# Patient Record
Sex: Male | Born: 1955 | Race: White | Hispanic: No | Marital: Married | Smoking: Former smoker
Health system: Southern US, Community
[De-identification: ages and names within clinical notes are randomized; demographics above are authoritative.]

## PROBLEM LIST (undated history)

## (undated) DIAGNOSIS — M5020 Other cervical disc displacement, unspecified cervical region: Secondary | ICD-10-CM

## (undated) DIAGNOSIS — M549 Dorsalgia, unspecified: Secondary | ICD-10-CM

## (undated) HISTORY — PX: CORONARY ANGIOPLASTY: SHX604

---

## 2002-02-02 ENCOUNTER — Encounter: Payer: Self-pay | Admitting: Internal Medicine

## 2002-02-02 ENCOUNTER — Ambulatory Visit (HOSPITAL_COMMUNITY): Admission: RE | Admit: 2002-02-02 | Discharge: 2002-02-02 | Payer: Self-pay | Admitting: Internal Medicine

## 2002-02-04 ENCOUNTER — Ambulatory Visit (HOSPITAL_COMMUNITY): Admission: RE | Admit: 2002-02-04 | Discharge: 2002-02-04 | Payer: Self-pay | Admitting: Internal Medicine

## 2002-02-04 ENCOUNTER — Encounter: Payer: Self-pay | Admitting: Internal Medicine

## 2004-01-01 ENCOUNTER — Observation Stay (HOSPITAL_COMMUNITY): Admission: EM | Admit: 2004-01-01 | Discharge: 2004-01-02 | Payer: Self-pay | Admitting: Emergency Medicine

## 2006-11-23 ENCOUNTER — Emergency Department (HOSPITAL_COMMUNITY): Admission: EM | Admit: 2006-11-23 | Discharge: 2006-11-23 | Payer: Self-pay | Admitting: Emergency Medicine

## 2008-02-20 ENCOUNTER — Emergency Department (HOSPITAL_COMMUNITY): Admission: EM | Admit: 2008-02-20 | Discharge: 2008-02-20 | Payer: Self-pay | Admitting: Emergency Medicine

## 2008-08-15 ENCOUNTER — Ambulatory Visit (HOSPITAL_COMMUNITY): Admission: RE | Admit: 2008-08-15 | Discharge: 2008-08-15 | Payer: Self-pay | Admitting: Family Medicine

## 2008-09-28 ENCOUNTER — Ambulatory Visit (HOSPITAL_COMMUNITY): Admission: RE | Admit: 2008-09-28 | Discharge: 2008-09-28 | Payer: Self-pay | Admitting: Family Medicine

## 2009-08-25 ENCOUNTER — Ambulatory Visit (HOSPITAL_COMMUNITY): Admission: RE | Admit: 2009-08-25 | Discharge: 2009-08-25 | Payer: Self-pay | Admitting: Family Medicine

## 2010-08-26 ENCOUNTER — Encounter: Payer: Self-pay | Admitting: Family Medicine

## 2010-12-21 NOTE — Discharge Summary (Signed)
NAME:  Matthew Brandt, Matthew Brandt                           ACCOUNT NO.:  [id number]   MEDICAL RECORD NO.:  [id number]                   PATIENT TYPE:  INP   LOCATION:  A223                                 FACILITY:  [facility]   PHYSICIAN:  Kirk Ruths, M.D.            DATE OF BIRTH:  10-Jan-1956   DATE OF ADMISSION:  DATE OF DISCHARGE:  01/02/2004                                 DISCHARGE SUMMARY   DISCHARGE DIAGNOSES:  Chest pain, myocardial infarction ruled out.   HOSPITAL COURSE:  This is a 55 year old white male who awoke approximately 4  a.m. with a severe aching in his chest across the mid anterior chest,  rotating to the left shoulder.  The patient was treated in the emergency  room with morphine and nitroglycerin without significant relief, although he  did get significant relief with a GI cocktail.  The patient has had a  history of gastroesophageal reflux disease in the past.  He has a similar  episode two years ago, and underwent normal cardiac catheterization.  He was  diagnosed with gastroesophageal reflux disease.   The patient's cardiac enzymes initially and EKG initially were normal.  Serial enzymes were negative.  The patient's EKG was normal.  He is  significantly improved.  Some mild chest wall soreness.  Some mild  gastroesophageal reflux disease symptoms.  Due to the fact that he has a  normal catheterization within two years relieved by GI cocktail, I feel like  this is more likely gastroesophageal reflux disease.  We will treat him with  Aciphex twice a day for two weeks and then daily.  We will follow him up in  two weeks.  If he is still having problems, we will arrange for cardiology  and/or GI evaluation.     ___________________________________________                                         Kirk Ruths, M.D.   WMM/MEDQ  D:  01/02/2004  T:  01/02/2004  Job:  [id number]

## 2010-12-21 NOTE — H&P (Signed)
NAME:  Matthew Brandt, Matthew Brandt                           ACCOUNT NO.:  [id number]   MEDICAL RECORD NO.:  [id number]                   PATIENT TYPE:  EMS   LOCATION:  ED                                   FACILITY:  [facility]   PHYSICIAN:  Kirk Ruths, M.D.            DATE OF BIRTH:  05-15-1956   DATE OF ADMISSION:  01/01/2004  DATE OF DISCHARGE:                                HISTORY & PHYSICAL   CHIEF COMPLAINT:  Chest pain.   PRESENTING ILLNESS:  This is a 55 year old white male who awoke at  approximately 4 a.m. with what he described as severe aching chest pain  across the mid anterior chest area radiating up into the left shoulder.  The  patient was seen in the emergency room, given morphine and nitroglycerin.  He states the nitroglycerin did not help but was significantly relieved by a  GI cocktail.  The patient denies shortness of breath or diaphoresis or  nausea.  The patient did undergo a cardiac catheterization approximately 2  years ago for similar-type pain.  The coronaries were clear and it was felt  the patient had GERD.  Due to the severe nature of his chest pain he will be  admitted for rule out MI.  Initial cardiac enzymes and chest x-ray are  negative.   PAST MEDICAL HISTORY:  He is allergic to CODEINE which causes itching.  He  takes no medications regularly.  The patient is a nonsmoker.   REVIEW OF SYSTEMS:  Noncontributory.   PHYSICAL EXAMINATION:  GENERAL:  Well-developed, well-nourished white male  who appears in excellent health.  VITAL SIGNS:  He is afebrile.  Blood pressure is 150/95, pulse is 80 and  regular, respirations are 20 and unlabored.  HEENT:  TMs normal.  Pupils equal to light and accommodation.  Oropharynx  benign.  NECK:  Supple without JVD, bruit, or thyromegaly.  LUNGS:  Clear in all areas.  HEART:  Regular sinus rhythm with no murmur, gallop, or rub.  There is some  mild chest wall discomfort on palpation.  ABDOMEN:  Mild epigastric discomfort.   Otherwise, abdomen is soft and  nontender with normal active bowel sounds.  EXTREMITIES:  Without clubbing, cyanosis, or edema.  NEUROLOGIC:  Grossly intact.   ASSESSMENT:  1. Chest pain, rule out myocardial infarction.  2. Probable gastroesophageal reflux disease.     ___________________________________________                                         Kirk Ruths, M.D.   WMM/MEDQ  D:  01/01/2004  T:  01/01/2004  Job:  [id number]

## 2011-05-03 LAB — BASIC METABOLIC PANEL
GFR calc non Af Amer: 57 — ABNORMAL LOW
Potassium: 3.9
Sodium: 138

## 2011-05-03 LAB — DIFFERENTIAL
Eosinophils Relative: 0
Lymphocytes Relative: 18
Lymphs Abs: 0.7
Monocytes Absolute: 0.3

## 2011-05-03 LAB — CBC
HCT: 40.3
Hemoglobin: 13.8
WBC: 3.9 — ABNORMAL LOW

## 2014-10-30 ENCOUNTER — Encounter (HOSPITAL_COMMUNITY): Payer: Self-pay | Admitting: Emergency Medicine

## 2014-10-30 ENCOUNTER — Emergency Department (HOSPITAL_COMMUNITY)
Admission: EM | Admit: 2014-10-30 | Discharge: 2014-10-30 | Disposition: A | Discharge status: Home / Self Care | Payer: 59 | Attending: Emergency Medicine | Admitting: Emergency Medicine

## 2014-10-30 DIAGNOSIS — Y288XXA Contact with other sharp object, undetermined intent, initial encounter: Secondary | ICD-10-CM | POA: Insufficient documentation

## 2014-10-30 DIAGNOSIS — Y9389 Activity, other specified: Secondary | ICD-10-CM | POA: Insufficient documentation

## 2014-10-30 DIAGNOSIS — M549 Dorsalgia, unspecified: Secondary | ICD-10-CM | POA: Diagnosis not present

## 2014-10-30 DIAGNOSIS — Z87891 Personal history of nicotine dependence: Secondary | ICD-10-CM | POA: Diagnosis not present

## 2014-10-30 DIAGNOSIS — S61219A Laceration without foreign body of unspecified finger without damage to nail, initial encounter: Secondary | ICD-10-CM

## 2014-10-30 DIAGNOSIS — S61211A Laceration without foreign body of left index finger without damage to nail, initial encounter: Secondary | ICD-10-CM | POA: Diagnosis present

## 2014-10-30 DIAGNOSIS — Y998 Other external cause status: Secondary | ICD-10-CM | POA: Diagnosis not present

## 2014-10-30 DIAGNOSIS — Y9289 Other specified places as the place of occurrence of the external cause: Secondary | ICD-10-CM | POA: Insufficient documentation

## 2014-10-30 HISTORY — DX: Dorsalgia, unspecified: M54.9

## 2014-10-30 HISTORY — DX: Other cervical disc displacement, unspecified cervical region: M50.20

## 2014-10-30 MED ORDER — TETANUS-DIPHTH-ACELL PERTUSSIS 5-2.5-18.5 LF-MCG/0.5 IM SUSP: 0.5000 mL | Freq: Once | INTRAMUSCULAR | Status: DC

## 2014-10-30 MED ORDER — TETANUS-DIPHTH-ACELL PERTUSSIS 5-2.5-18.5 LF-MCG/0.5 IM SUSP
INTRAMUSCULAR | Status: AC
  Filled 2014-10-30: qty 0.5

## 2014-10-30 MED ORDER — BACITRACIN ZINC 500 UNIT/GM EX OINT
TOPICAL_OINTMENT | CUTANEOUS | Status: AC
  Filled 2014-10-30: qty 0.9

## 2014-10-30 MED ORDER — LIDOCAINE HCL (PF) 1 % IJ SOLN
INTRAMUSCULAR | Status: AC
  Filled 2014-10-30: qty 5

## 2014-10-30 MED ORDER — POVIDONE-IODINE 10 % EX SOLN
CUTANEOUS | Status: AC
  Filled 2014-10-30: qty 118

## 2014-10-30 NOTE — Discharge Instructions (Signed)
Please keep the wounds clean and dry. Please have your sutures removed in the next 7-10 days. Please see your primary physician, or return to the emergency department if any signs of advancing infection. Sutured Wound Care Sutures are stitches that can be used to close wounds. Caring for your wound can help stop infection and lessen pain. HOME CARE   Rest and raise (elevate) the injured area until the pain and puffiness (swelling) go away.  Only take medicines as told by your doctor.  Clean the wound gently with mild soap and water once a day after the first 2 days. Rinse off the soap. Pat the area dry with a clean towel. Do not rub the wound.  Change the bandage (dressing) as told by your doctor. If the bandage sticks, soak it off with soapy water. Stop using a bandage after 2 days or after the wound stops leaking fluid.  Put cream on the wound as told by your doctor.  Do not stretch the wound.  Drink enough fluids to keep your pee (urine) clear or pale yellow.  See your doctor to have the sutures removed.  Use sunscreen or sunblock on the wound after it heals. GET HELP RIGHT AWAY IF:   Your wound gets red, puffy, hot, or tender.  You have more pain in the wound.  You have a red streak that goes away from the wound.  You see yellowish-white fluid (pus) coming out of the wound.  You have a fever.  You have chills and start to shake.  You notice a bad smell coming from the wound.  Your wound will not stop bleeding. MAKE SURE YOU:   Understand these instructions.  Will watch your condition.  Will get help right away if you are not doing well or get worse. Document Released: 01/08/2008 Document Revised: 10/14/2011 Document Reviewed: 11/25/2010 [facility] Patient Information 2015 [place], [place]. This information is not intended to replace advice given to you by your health care provider. Make sure you discuss any questions you have with your health care provider.

## 2014-10-30 NOTE — ED Notes (Addendum)
Patient reports cut left index and left middle finger while cutting ham. No active bleeding at this time to lacerations.

## 2014-10-30 NOTE — ED Provider Notes (Signed)
CSN: [id number]     Arrival date & time 10/30/14  1422 History  This chart was scribed for Ivery Quale, PA-C, working with No att. providers found by Leona Carry, ED Scribe. The patient was seen in [facility]. The patient's care was started at 3:08 PM.     Chief Complaint  Patient presents with  . Extremity Laceration   Patient is a 59 y.o. male presenting with skin laceration. The history is provided by the patient. No language interpreter was used.  Laceration Location:  Finger Finger laceration location:  L index finger and L middle finger Depth:  Cutaneous Time since incident:  1 hour Laceration mechanism:  Knife Pain details:    Quality:  Aching   Severity:  Moderate   Timing:  Intermittent   Progression:  Worsening Foreign body present:  No foreign bodies Relieved by:  Pressure Worsened by:  Nothing tried Tetanus status:  Up to date  HPI Comments: Matthew Brandt is a 59 y.o. male who presents to the Emergency Department complaining of a laceration to his left index finger and middle finger that occurred immediately prior to arrival. Patient reports that the injury occurred while he was carving a ham. He denies a history of bleeding disorders. No past surgical history to the fingers on his left hand. Patient reports that his tetanus is not up to date.  PCP is Dr. Phillips Odor.  Past Medical History  Diagnosis Date  . Back pain   . Herniated disc, cervical    History reviewed. No pertinent past surgical history. History reviewed. No pertinent family history. History  Substance Use Topics  . Smoking status: Former Games developer  . Smokeless tobacco: Not on file  . Alcohol Use: No    Review of Systems  Musculoskeletal: Positive for back pain.  Skin: Positive for wound (laceration).  All other systems reviewed and are negative.     Allergies  Codeine  Home Medications   Prior to Admission medications   Not on File   Triage Vitals: BP 171/99 mmHg  Pulse 82   Temp(Src) 98.2 F (36.8 C) (Oral)  Resp 20  Ht  (1.803 m)  Wt 189 lb 9.6 oz (86.002 kg)  BMI 26.46 kg/m2  SpO2 97% Physical Exam  Constitutional: He is oriented to person, place, and time. He appears well-developed and well-nourished. No distress.  HENT:  Head: Normocephalic and atraumatic.  Eyes: Conjunctivae and EOM are normal.  Neck: Neck supple. No tracheal deviation present.  Cardiovascular: Normal rate.   Pulmonary/Chest: Effort normal. No respiratory distress.  Musculoskeletal: Normal range of motion.  Full ROM of all fingers on the left hand. 1.4 cm laceration of the left index finger between the PIP and DIP joint. Some active bleeding. 1.2 cm laceration of the left index finger just below the DIP joint. No crepitus, no deformity, no bone involvement.  Neurological: He is alert and oriented to person, place, and time.  Skin: Skin is warm and dry.  Psychiatric: He has a normal mood and affect. His behavior is normal.  Nursing note and vitals reviewed.   ED Course  Procedures (including critical care time)   LACERATION REPAIR OF LEFT INDEX AND MIDDLE FINGER. Patient identified by arm band. Permission for the procedure is given by the patient. The patient sustained laceration to the left index and middle finger. The procedure was explained to the patient in terms which he understands and he is in agreement with this plan.  The left index finger  was painted with Betadine. Procedural time out taken. A digital block was completed with 1% plain lidocaine. After proper anesthetic, the patient was draped in the usual sterile fashion. The wound was irrigated with saline. The finger was then again painted with Betadine. The area was repaired with 3 interrupted sutures of 4-0 nylon. The wound measures 1.4 cm. Sterile dressing applied.  The left middle finger was painted with Betadine. A digital block was completed with 1% plain lidocaine. After proper anesthetic the patient was  draped in the usual sterile manner. The wound was irrigated with saline. The finger was again painted with Betadine and repaired with 3 interrupted sutures of 4-0 nylon. The wound measures 1.2 cm. There was no foreign body appreciated in either of the index finger or middle finger lacerations. It was no bone or tendon involvement. After the procedure the patient has good range of motion of the fingers, and no sensory deficits. Sterile dressing applied to the middle finger. Patient tolerated the procedures without problem. DIAGNOSTIC STUDIES: Oxygen Saturation is 97% on room air, normal by my interpretation.    COORDINATION OF CARE:Discharge instructions reviewed. Pt in agreement with d/c plan.    Labs Review Labs Reviewed - No data to display  Imaging Review No results found.   EKG Interpretation None      MDM  Patient sustained a 1.2 cm laceration of the left middle finger, a 1.4 cm laceration of the index finger of the left hand. The wounds were repaired without problem. There is no bone or tendon involvement.    Final diagnoses:  None    *I have reviewed nursing notes, vital signs, and all appropriate lab and imaging results for this patient.**  **I personally performed the services described in this documentation, which was scribed in my presence. The recorded information has been reviewed and is accurate.Ivery Quale*   Matthew Kozakiewicz, PA-C 11/02/14 1447  Vanetta MuldersScott Zackowski, MD 11/09/14 [phone]

## 2015-05-25 ENCOUNTER — Encounter (INDEPENDENT_AMBULATORY_CARE_PROVIDER_SITE_OTHER): Payer: Self-pay | Admitting: *Deleted

## 2016-09-06 DIAGNOSIS — J069 Acute upper respiratory infection, unspecified: Secondary | ICD-10-CM | POA: Diagnosis not present

## 2017-05-08 DIAGNOSIS — L905 Scar conditions and fibrosis of skin: Secondary | ICD-10-CM | POA: Diagnosis not present

## 2017-05-08 DIAGNOSIS — Z933 Colostomy status: Secondary | ICD-10-CM | POA: Diagnosis not present

## 2017-05-12 DIAGNOSIS — Z23 Encounter for immunization: Secondary | ICD-10-CM | POA: Diagnosis not present

## 2017-07-12 ENCOUNTER — Encounter (HOSPITAL_COMMUNITY): Payer: Self-pay

## 2017-07-12 ENCOUNTER — Emergency Department (HOSPITAL_COMMUNITY)
Admission: EM | Admit: 2017-07-12 | Discharge: 2017-07-12 | Disposition: A | Discharge status: Home / Self Care | Payer: 59 | Attending: Emergency Medicine | Admitting: Emergency Medicine

## 2017-07-12 DIAGNOSIS — Y9389 Activity, other specified: Secondary | ICD-10-CM | POA: Insufficient documentation

## 2017-07-12 DIAGNOSIS — T1591XA Foreign body on external eye, part unspecified, right eye, initial encounter: Secondary | ICD-10-CM | POA: Diagnosis not present

## 2017-07-12 DIAGNOSIS — H5711 Ocular pain, right eye: Secondary | ICD-10-CM | POA: Diagnosis not present

## 2017-07-12 DIAGNOSIS — Z87891 Personal history of nicotine dependence: Secondary | ICD-10-CM | POA: Insufficient documentation

## 2017-07-12 DIAGNOSIS — Y929 Unspecified place or not applicable: Secondary | ICD-10-CM | POA: Diagnosis not present

## 2017-07-12 DIAGNOSIS — T1501XA Foreign body in cornea, right eye, initial encounter: Secondary | ICD-10-CM | POA: Diagnosis not present

## 2017-07-12 DIAGNOSIS — X58XXXA Exposure to other specified factors, initial encounter: Secondary | ICD-10-CM | POA: Diagnosis not present

## 2017-07-12 DIAGNOSIS — Y999 Unspecified external cause status: Secondary | ICD-10-CM | POA: Insufficient documentation

## 2017-07-12 MED ORDER — FLUORESCEIN SODIUM 1 MG OP STRP
1.0000 | ORAL_STRIP | Freq: Once | OPHTHALMIC | Status: AC
  Administered 2017-07-12: 1 via OPHTHALMIC

## 2017-07-12 MED ORDER — TETRACAINE HCL 0.5 % OP SOLN
2.0000 [drp] | Freq: Once | OPHTHALMIC | Status: AC
  Administered 2017-07-12: 2 [drp] via OPHTHALMIC

## 2017-07-12 MED ORDER — TETRACAINE HCL 0.5 % OP SOLN: 2.0000 [drp] | Freq: Once | OPHTHALMIC | Status: DC

## 2017-07-12 MED ORDER — KETOROLAC TROMETHAMINE 0.5 % OP SOLN
1.0000 [drp] | Freq: Once | OPHTHALMIC | Status: AC
  Administered 2017-07-12: 1 [drp] via OPHTHALMIC
  Filled 2017-07-12: qty 5

## 2017-07-12 MED ORDER — TETRACAINE HCL 0.5 % OP SOLN
OPHTHALMIC | Status: AC
  Administered 2017-07-12: 2 [drp] via OPHTHALMIC
  Filled 2017-07-12: qty 4

## 2017-07-12 MED ORDER — FLUORESCEIN SODIUM 1 MG OP STRP
1.0000 | ORAL_STRIP | Freq: Once | OPHTHALMIC | Status: DC
Start: 2017-07-12 — End: 2017-07-12

## 2017-07-12 MED ORDER — FLUORESCEIN SODIUM 1 MG OP STRP
ORAL_STRIP | OPHTHALMIC | Status: AC
  Administered 2017-07-12: 1 via OPHTHALMIC
  Filled 2017-07-12: qty 1

## 2017-07-12 NOTE — ED Triage Notes (Signed)
Pt reports some dust got in his r eye 1 week ago.  Reports eye still hurts.

## 2017-07-12 NOTE — Discharge Instructions (Signed)
You may use the eye drop given placing one drop in your right eye every 6 hours if needed for continued irritation and redness.  This medicine is an anti inflammatory (like motrin for the eye).

## 2017-07-12 NOTE — ED Provider Notes (Signed)
[facility]ENC[facility] Provider Note   CSN: [id number] Arrival date & time: 07/12/17  1029     History   Chief Complaint Chief Complaint  Patient presents with  . Eye Pain    HPI Tillie FantasiaWayne D Jenne is a 61 y.o. male presenting with right eye pain and foreign body sensation times 1 week.  He was helping remove a door from an old home a week ago and was caring at outside when he felt something fall in his eye.  After multiple flushings he has been unable to retrieve the object.  He describes it feeling constantly irritated under his upper lateral right eyelid, sometimes floating to the middle of this right eyelid.  He denies any visual changes or drainage except for clear tearing.  He has found no alleviators for symptoms.  He wears corrective lenses.   The history is provided by the patient.    Past Medical History:  Diagnosis Date  . Back pain   . Herniated disc, cervical     There are no active problems to display for this patient.   History reviewed. No pertinent surgical history.     Home Medications    Prior to Admission medications   Not on File    Family History No family history on file.  Social History Social History   Tobacco Use  . Smoking status: Former Games developermoker  . Smokeless tobacco: Never Used  Substance Use Topics  . Alcohol use: Yes    Comment: occ  . Drug use: No     Allergies   Codeine   Review of Systems Review of Systems  Constitutional: Negative for chills and fever.  HENT: Negative for congestion, ear pain, rhinorrhea, sinus pressure, sore throat, trouble swallowing and voice change.   Eyes: Positive for pain and redness. Negative for discharge and visual disturbance.  Respiratory: Negative for cough, shortness of breath, wheezing and stridor.   Cardiovascular: Negative for chest pain.  Gastrointestinal: Negative for abdominal pain.  Genitourinary: Negative.      Physical Exam Updated Vital Signs BP (!) 159/96 (BP  Location: Right Arm)   Pulse 92   Temp 98.2 F (36.8 C) (Oral)   Resp 18   Ht 5\' 11"  (1.803 m)   Wt 86.2 kg (190 lb)   SpO2 97%   BMI 26.50 kg/m   Physical Exam  Constitutional: He is oriented to person, place, and time. He appears well-developed and well-nourished.  HENT:  Head: Normocephalic and atraumatic.  Nose: No mucosal edema or rhinorrhea.  Mouth/Throat: Uvula is midline, oropharynx is clear and moist and mucous membranes are normal. No oropharyngeal exudate, posterior oropharyngeal edema, posterior oropharyngeal erythema or tonsillar abscesses.  Eyes: EOM are normal. Pupils are equal, round, and reactive to light. Foreign body present in the right eye. Right conjunctiva is injected.  Slit lamp exam:      The right eye shows no corneal abrasion, no corneal ulcer and no fluorescein uptake.  Small black foreign body retrieved from mid upper eyelid using cotton swab without difficulty.  Bilateral Distance: 20/20 R Distance: 20/25 L Distance: 20/25    Cardiovascular: Normal rate and normal heart sounds.  Pulmonary/Chest: Effort normal. No respiratory distress. He has no wheezes.  Musculoskeletal: Normal range of motion.  Neurological: He is alert and oriented to person, place, and time.  Skin: Skin is warm and dry. No rash noted.  Psychiatric: He has a normal mood and affect.     ED Treatments / Results  Labs (all labs ordered are listed, but only abnormal results are displayed) Labs Reviewed - No data to display  EKG  EKG Interpretation None       Radiology No results found.  Procedures .Foreign Body Removal Date/Time: 07/12/2017 12:29 PM Performed by: Burgess AmorIdol, Marissah Vandemark, PA-C Authorized by: Burgess AmorIdol, Aydin Cavalieri, PA-C  Consent: Verbal consent obtained. Consent given by: patient Patient understanding: patient states understanding of the procedure being performed Patient identity confirmed: verbally with patient Time out: Immediately prior to procedure a "time out" was  called to verify the correct patient, procedure, equipment, support staff and site/side marked as required. Body area: eye Location details: right eyelid  Anesthesia: Local Anesthetic: tetracaine drops Localization method: eyelid eversion Removal mechanism: moist cotton swab Eye examined with fluorescein. No fluorescein uptake. Depth: superficial Complexity: simple 1 objects recovered. Objects recovered: black dust Post-procedure assessment: foreign body removed Patient tolerance: Patient tolerated the procedure well with no immediate complications   (including critical care time)  Medications Ordered in ED Medications  ketorolac (ACULAR) 0.5 % ophthalmic solution 1 drop (not administered)  fluorescein ophthalmic strip 1 strip (1 strip Right Eye Given 07/12/17 1109)  tetracaine (PONTOCAINE) 0.5 % ophthalmic solution 2 drop (2 drops Right Eye Given 07/12/17 1109)     Initial Impression / Assessment and Plan / ED Course  I have reviewed the triage vital signs and the nursing notes.  Pertinent labs & imaging results that were available during my care of the patient were reviewed by me and considered in my medical decision making (see chart for details).     Foreign body right eye which was retrieved, no residual symptoms.  He was given ophthalmology referral for as needed use if needed.  Final Clinical Impressions(s) / ED Diagnoses   Final diagnoses:  Foreign body of right eye, initial encounter    ED Discharge Orders    None       Victoriano Laindol, Lashanda Storlie, PA-C 07/12/17 1230    Samuel JesterMcManus, Kathleen, DO 07/12/17 1442

## 2017-07-22 DIAGNOSIS — R6883 Chills (without fever): Secondary | ICD-10-CM | POA: Diagnosis not present

## 2017-07-22 DIAGNOSIS — J029 Acute pharyngitis, unspecified: Secondary | ICD-10-CM | POA: Diagnosis not present

## 2017-07-22 DIAGNOSIS — R05 Cough: Secondary | ICD-10-CM | POA: Diagnosis not present

## 2017-07-22 DIAGNOSIS — Z1389 Encounter for screening for other disorder: Secondary | ICD-10-CM | POA: Diagnosis not present

## 2018-04-03 DIAGNOSIS — I10 Essential (primary) hypertension: Secondary | ICD-10-CM | POA: Diagnosis not present

## 2018-04-03 DIAGNOSIS — Z23 Encounter for immunization: Secondary | ICD-10-CM | POA: Diagnosis not present

## 2018-04-03 DIAGNOSIS — E663 Overweight: Secondary | ICD-10-CM | POA: Diagnosis not present

## 2018-04-03 DIAGNOSIS — Z0001 Encounter for general adult medical examination with abnormal findings: Secondary | ICD-10-CM | POA: Diagnosis not present

## 2019-10-28 DIAGNOSIS — Z23 Encounter for immunization: Secondary | ICD-10-CM | POA: Diagnosis not present

## 2019-11-20 DIAGNOSIS — Z23 Encounter for immunization: Secondary | ICD-10-CM | POA: Diagnosis not present

## 2020-09-30 ENCOUNTER — Encounter (HOSPITAL_COMMUNITY): Payer: Self-pay | Admitting: *Deleted

## 2020-09-30 ENCOUNTER — Other Ambulatory Visit: Payer: Self-pay

## 2020-09-30 ENCOUNTER — Emergency Department (HOSPITAL_COMMUNITY): Payer: BC Managed Care – PPO

## 2020-09-30 ENCOUNTER — Emergency Department (HOSPITAL_COMMUNITY)
Admission: EM | Admit: 2020-09-30 | Discharge: 2020-09-30 | Disposition: A | Discharge status: Home / Self Care | Payer: BC Managed Care – PPO | Attending: Emergency Medicine | Admitting: Emergency Medicine

## 2020-09-30 DIAGNOSIS — Z23 Encounter for immunization: Secondary | ICD-10-CM | POA: Insufficient documentation

## 2020-09-30 DIAGNOSIS — W293XXA Contact with powered garden and outdoor hand tools and machinery, initial encounter: Secondary | ICD-10-CM | POA: Insufficient documentation

## 2020-09-30 DIAGNOSIS — Y9389 Activity, other specified: Secondary | ICD-10-CM | POA: Insufficient documentation

## 2020-09-30 DIAGNOSIS — S8992XA Unspecified injury of left lower leg, initial encounter: Secondary | ICD-10-CM | POA: Diagnosis not present

## 2020-09-30 DIAGNOSIS — Z87891 Personal history of nicotine dependence: Secondary | ICD-10-CM | POA: Insufficient documentation

## 2020-09-30 DIAGNOSIS — S81812A Laceration without foreign body, left lower leg, initial encounter: Secondary | ICD-10-CM | POA: Insufficient documentation

## 2020-09-30 MED ORDER — LIDOCAINE-EPINEPHRINE (PF) 2 %-1:200000 IJ SOLN
10.0000 mL | Freq: Once | INTRAMUSCULAR | Status: AC
  Administered 2020-09-30: 10 mL

## 2020-09-30 MED ORDER — TETANUS-DIPHTH-ACELL PERTUSSIS 5-2.5-18.5 LF-MCG/0.5 IM SUSY
0.5000 mL | PREFILLED_SYRINGE | Freq: Once | INTRAMUSCULAR | Status: AC
  Administered 2020-09-30: 0.5 mL via INTRAMUSCULAR
  Filled 2020-09-30: qty 0.5

## 2020-09-30 MED ORDER — POVIDONE-IODINE 10 % EX SOLN
CUTANEOUS | Status: AC
  Filled 2020-09-30: qty 15

## 2020-09-30 MED ORDER — CEPHALEXIN 500 MG PO CAPS: 500.0000 mg | ORAL_CAPSULE | Freq: Two times a day (BID) | ORAL | 0 refills | Status: AC

## 2020-09-30 MED ORDER — LIDOCAINE HCL (PF) 1 % IJ SOLN
INTRAMUSCULAR | Status: AC
  Filled 2020-09-30: qty 30

## 2020-09-30 NOTE — Discharge Instructions (Addendum)
You have a laceration of your left leg.  You received 6 stitches.  I would like you to keep the wound dry for the first 24 hours after that I would like you to run clean water over and change the dressings twice a day.  I have also started you on antibiotics please take as prescribed.  You may take over-the-counter pain medications like ibuprofen and/or Tylenol every 6 hours as needed please follow dosaging on the back of bottle  I would like you to follow-up in the next 8 to 10 days for suture removal, you may come back to this department, urgent care, your PCP.  Come back to the emergency department if you develop chest pain, shortness of breath, severe abdominal pain, uncontrolled nausea, vomiting, diarrhea.

## 2020-09-30 NOTE — ED Provider Notes (Signed)
[facility] EMERGENCY DEPARTMENT Provider Note   CSN: [id number] Arrival date & time: 09/30/20  1432     History Chief Complaint  Patient presents with  . Laceration    Matthew Brandt is a 65 y.o. male.  HPI   Patient with significant medical history of coronary angioplasty presents to the emergency department with chief complaint of laceration to his left leg.  He endorses that he was using a chain saw  and was cutting a piece of wood, the chainsaw missed the wood and cut the end of his left leg.  He noted diffuse amount of blood and was able to stop it with direct pressure.  His brother-in-law who was present applied a makeshift tourniquet around the leg.  He denies paresthesia or weakness in that leg, states he was able to ambulate after the incident.  He does not remember the last time he had his tetanus shot, is not immunocompromise.  He denies any alleviating factors.  Patient is not on anticoagulant.  Patient denies headaches, fevers, chills, shortness of breath, chest pain, abdominal pain, nausea, vomiting, diarrhea, worsening pedal edema.  History reviewed. No pertinent past medical history.  There are no problems to display for this patient.   Past Surgical History:  Procedure Laterality Date  . CORONARY ANGIOPLASTY         No family history on file.  Social History   Tobacco Use  . Smoking status: Former Games developer  . Smokeless tobacco: Never Used  Substance Use Topics  . Alcohol use: Yes  . Drug use: Never    Home Medications Prior to Admission medications   Medication Sig Start Date End Date Taking? Authorizing Provider  cephALEXin (KEFLEX) 500 MG capsule Take 1 capsule (500 mg total) by mouth 2 (two) times daily for 7 days. 09/30/20 10/07/20 Yes Carroll Sage, PA-C    Allergies    Codeine  Review of Systems   Review of Systems  Constitutional: Negative for chills and fever.  HENT: Negative for congestion and sore throat.   Respiratory: Negative for  shortness of breath.   Cardiovascular: Negative for chest pain.  Gastrointestinal: Negative for abdominal pain.  Genitourinary: Negative for enuresis.  Musculoskeletal: Negative for back pain.  Skin: Positive for wound. Negative for rash.       Laceration to the end of his left leg.  Neurological: Negative for dizziness and headaches.  Hematological: Does not bruise/bleed easily.    Physical Exam Updated Vital Signs BP (!) 159/96   Pulse 100   Temp 98 F (36.7 C)   Resp 18   Ht 5\' 11"  (1.803 m)   Wt 86.2 kg   SpO2 93%   BMI 26.50 kg/m   Physical Exam Vitals and nursing note reviewed.  Constitutional:      General: He is not in acute distress.    Appearance: He is not ill-appearing.  HENT:     Head: Normocephalic and atraumatic.     Nose: No congestion.  Eyes:     Conjunctiva/sclera: Conjunctivae normal.  Cardiovascular:     Rate and Rhythm: Regular rhythm. Tachycardia present.     Pulses: Normal pulses.  Pulmonary:     Effort: Pulmonary effort is normal.  Musculoskeletal:        General: Tenderness and signs of injury present.     Comments: Patient's left leg was visualized, there is noted a 3 cm laceration horizontally at the distal end of his tibia.  It was hemodynamically stable, no signs  of infection present.  He had full range of motion of his toes and ankle, neurovascular fully intact.  Skin:    General: Skin is warm and dry.  Neurological:     Mental Status: He is alert.  Psychiatric:        Mood and Affect: Mood normal.     ED Results / Procedures / Treatments   Labs (all labs ordered are listed, but only abnormal results are displayed) Labs Reviewed - No data to display  EKG None  Radiology DG Tibia/Fibula Left  Result Date: 09/30/2020 CLINICAL DATA:  Laceration from chainsaw today at the distal aspect of lower leg EXAM: LEFT TIBIA AND FIBULA - 2 VIEW COMPARISON:  None. FINDINGS: There is no evidence of fracture or other focal bone lesions. No  radiopaque foreign body. IMPRESSION: No acute osseous abnormality or radiopaque foreign body. Electronically Signed   By: Maudry Mayhew MD   On: 09/30/2020 15:06    Procedures .Marland KitchenLaceration Repair  Date/Time: 09/30/2020 5:10 PM Performed by: Carroll Sage, PA-C Authorized by: Carroll Sage, PA-C   Consent:    Consent obtained:  Verbal   Consent given by:  Patient   Risks discussed:  Infection, pain, retained foreign body, need for additional repair, poor cosmetic result, tendon damage, vascular damage, poor wound healing and nerve damage   Alternatives discussed:  No treatment Universal protocol:    Patient identity confirmed:  Verbally with patient Anesthesia:    Anesthesia method:  Local infiltration   Local anesthetic:  Lidocaine 1% w/o epi Laceration details:    Location:  Leg   Leg location:  L lower leg   Length (cm):  3   Depth (mm):  1 Pre-procedure details:    Preparation:  Patient was prepped and draped in usual sterile fashion and imaging obtained to evaluate for foreign bodies Exploration:    Hemostasis achieved with:  Direct pressure   Imaging obtained: x-ray     Imaging outcome: foreign body not noted     Wound exploration: wound explored through full range of motion and entire depth of wound visualized     Contaminated: no   Treatment:    Area cleansed with:  Betadine and saline   Amount of cleaning:  Standard   Irrigation solution:  Sterile saline   Irrigation method:  Syringe   Visualized foreign bodies/material removed: no     Debridement:  None Skin repair:    Repair method:  Sutures   Suture size:  4-0   Suture material:  Prolene   Suture technique:  Simple interrupted   Number of sutures:  6 Approximation:    Approximation:  Close Repair type:    Repair type:  Simple Post-procedure details:    Dressing:  Non-adherent dressing   Procedure completion:  Tolerated well, no immediate complications     Medications Ordered in  ED Medications  lidocaine (PF) (XYLOCAINE) 1 % injection (has no administration in time range)  lidocaine-EPINEPHrine (XYLOCAINE W/EPI) 2 %-1:200000 (PF) injection 10 mL (10 mLs Infiltration Given 09/30/20 1537)  Tdap (BOOSTRIX) injection 0.5 mL (0.5 mLs Intramuscular Given 09/30/20 1536)    ED Course  I have reviewed the triage vital signs and the nursing notes.  Pertinent labs & imaging results that were available during my care of the patient were reviewed by me and considered in my medical decision making (see chart for details).    MDM Rules/Calculators/A&P  Initial impression-patient presents with laceration to his left distal tibia.  He is alert, does not appear in acute distress, vital signs notable for tachycardia.  Will obtain imaging for further evaluation, update tetanus shot and recommend sutures for improved healing process.  Work-up-x-ray negative for acute findings.  Reassessment Will recommend suturing to decrease infection risk and to assist with the healing process.  Patient was agreeable with this and tolerated the procedure well.  He received 6 sutures.  Neurovascular was fully intact after the procedure.  Rule out- Low suspicion for fracture or dislocation as x-ray does not reveal any acute findings. low suspicion for ligament or tendon damage as area was palpated no gross defects noted, he had full range of motion in his ankle and toes.  Low suspicion for compartment syndrome as area was palpated it was soft to the touch, neurovascular fully intact before and after the procedure   Plan-patient received 6 sutures to close his laceration. We will provide him with antibiotics because it was a dirty wound. Will educate on patient wound care and follow-up in 8 to 10 days for suture removal.  Vital signs have remained stable, no indication for hospital admission.  Patient discussed with attending and they agreed with assessment and plan.  Patient given  at home care as well strict return precautions.  Patient verbalized that they understood agreed to said plan.   Final Clinical Impression(s) / ED Diagnoses Final diagnoses:  Laceration of left lower extremity, initial encounter    Rx / DC Orders ED Discharge Orders         Ordered    cephALEXin (KEFLEX) 500 MG capsule  2 times daily        09/30/20 1544           Barnie Del 09/30/20 1713    Bethann Berkshire, MD 10/02/20 1719

## 2020-09-30 NOTE — ED Triage Notes (Signed)
Pt c/o left lower leg laceration from chainsaw today. Pt takes ASA daily. Pt has a belt tied above the cut on left lower leg in place of a tourniquet from home.

## 2021-05-18 DIAGNOSIS — Z23 Encounter for immunization: Secondary | ICD-10-CM | POA: Diagnosis not present

## 2021-06-11 DIAGNOSIS — J22 Unspecified acute lower respiratory infection: Secondary | ICD-10-CM | POA: Diagnosis not present

## 2021-06-11 DIAGNOSIS — R059 Cough, unspecified: Secondary | ICD-10-CM | POA: Diagnosis not present

## 2022-05-03 DIAGNOSIS — Z23 Encounter for immunization: Secondary | ICD-10-CM | POA: Diagnosis not present

## 2022-06-07 DIAGNOSIS — E119 Type 2 diabetes mellitus without complications: Secondary | ICD-10-CM | POA: Diagnosis not present

## 2022-06-07 DIAGNOSIS — R7309 Other abnormal glucose: Secondary | ICD-10-CM | POA: Diagnosis not present

## 2022-06-07 DIAGNOSIS — Z6825 Body mass index (BMI) 25.0-25.9, adult: Secondary | ICD-10-CM | POA: Diagnosis not present

## 2022-06-07 DIAGNOSIS — Z0000 Encounter for general adult medical examination without abnormal findings: Secondary | ICD-10-CM | POA: Diagnosis not present

## 2022-06-07 DIAGNOSIS — E663 Overweight: Secondary | ICD-10-CM | POA: Diagnosis not present

## 2022-06-07 DIAGNOSIS — Z23 Encounter for immunization: Secondary | ICD-10-CM | POA: Diagnosis not present

## 2022-06-07 DIAGNOSIS — E785 Hyperlipidemia, unspecified: Secondary | ICD-10-CM | POA: Diagnosis not present

## 2022-08-28 IMAGING — DX DG TIBIA/FIBULA 2V*L*
4 series · 4 of 4 positions shown · non-contrast
Comparison: None.

CLINICAL DATA: Laceration from chainsaw today at the distal aspect
of lower leg

EXAM:
LEFT TIBIA AND FIBULA - 2 VIEW

[tibia ap (1 of 2)]
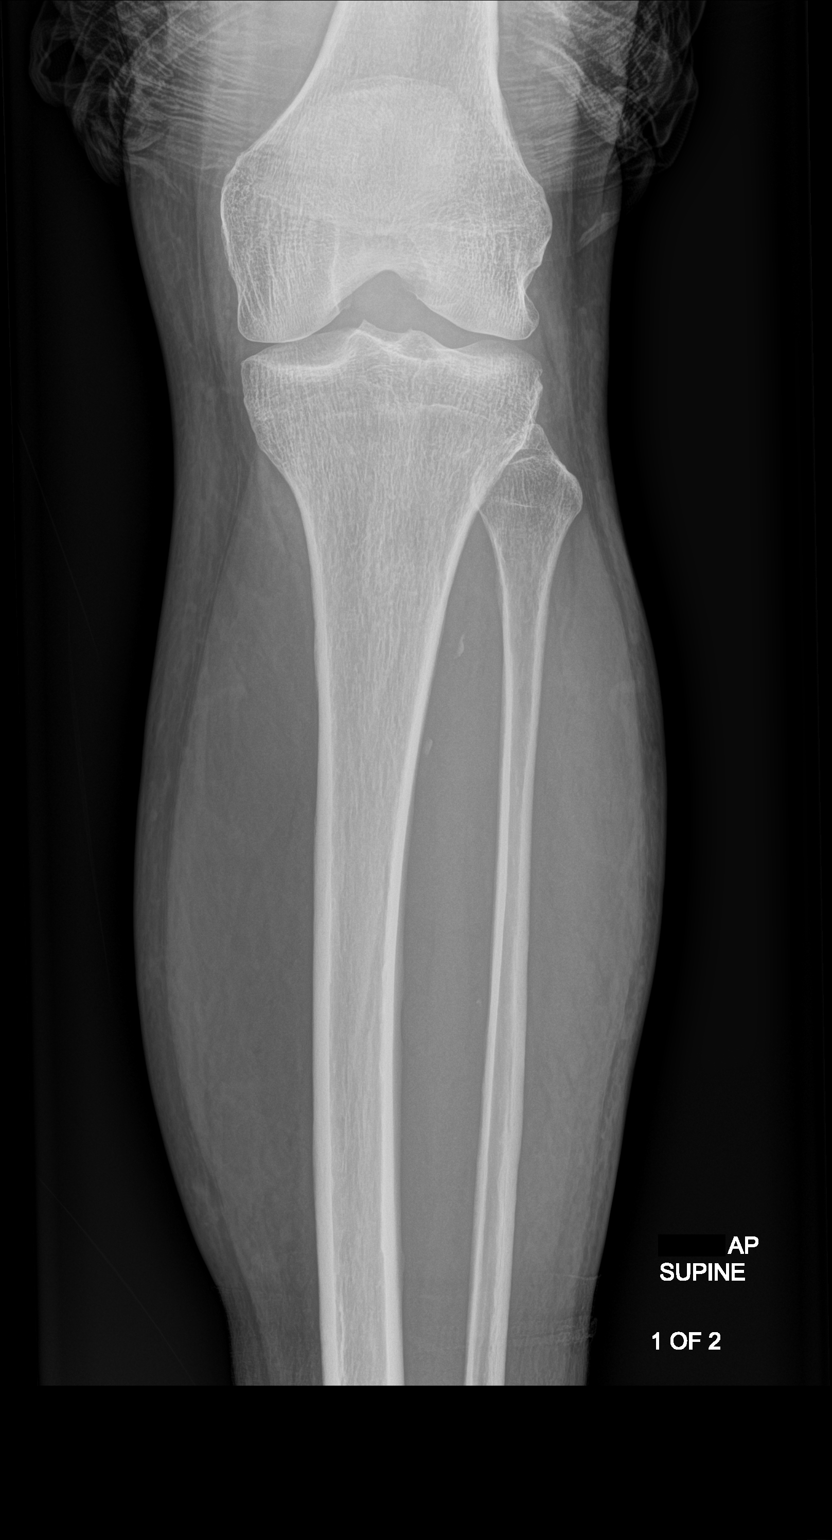

[tibia ap (2 of 2)]
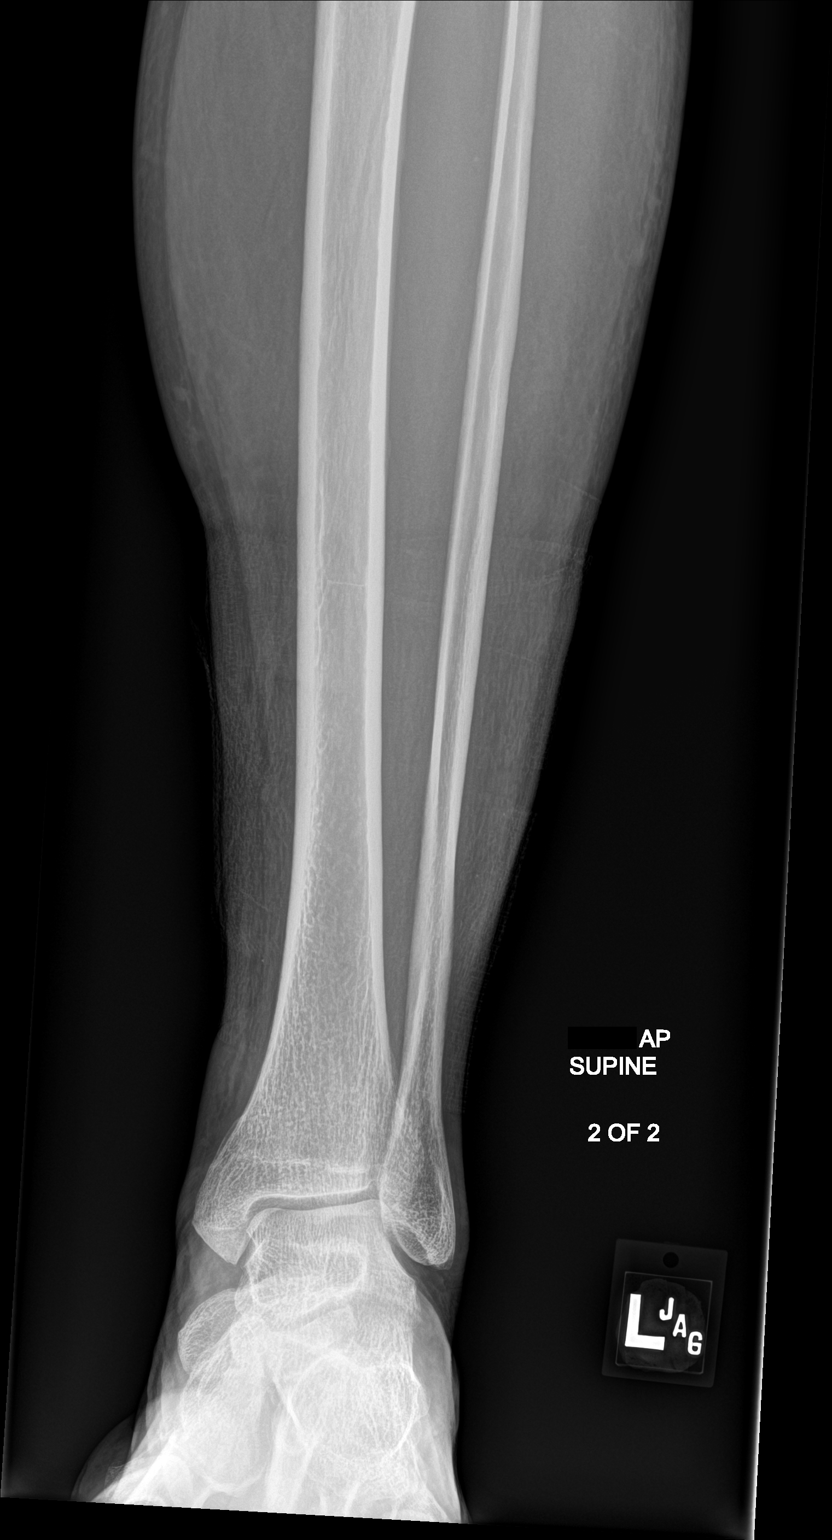

[tibia lat (1 of 2)]
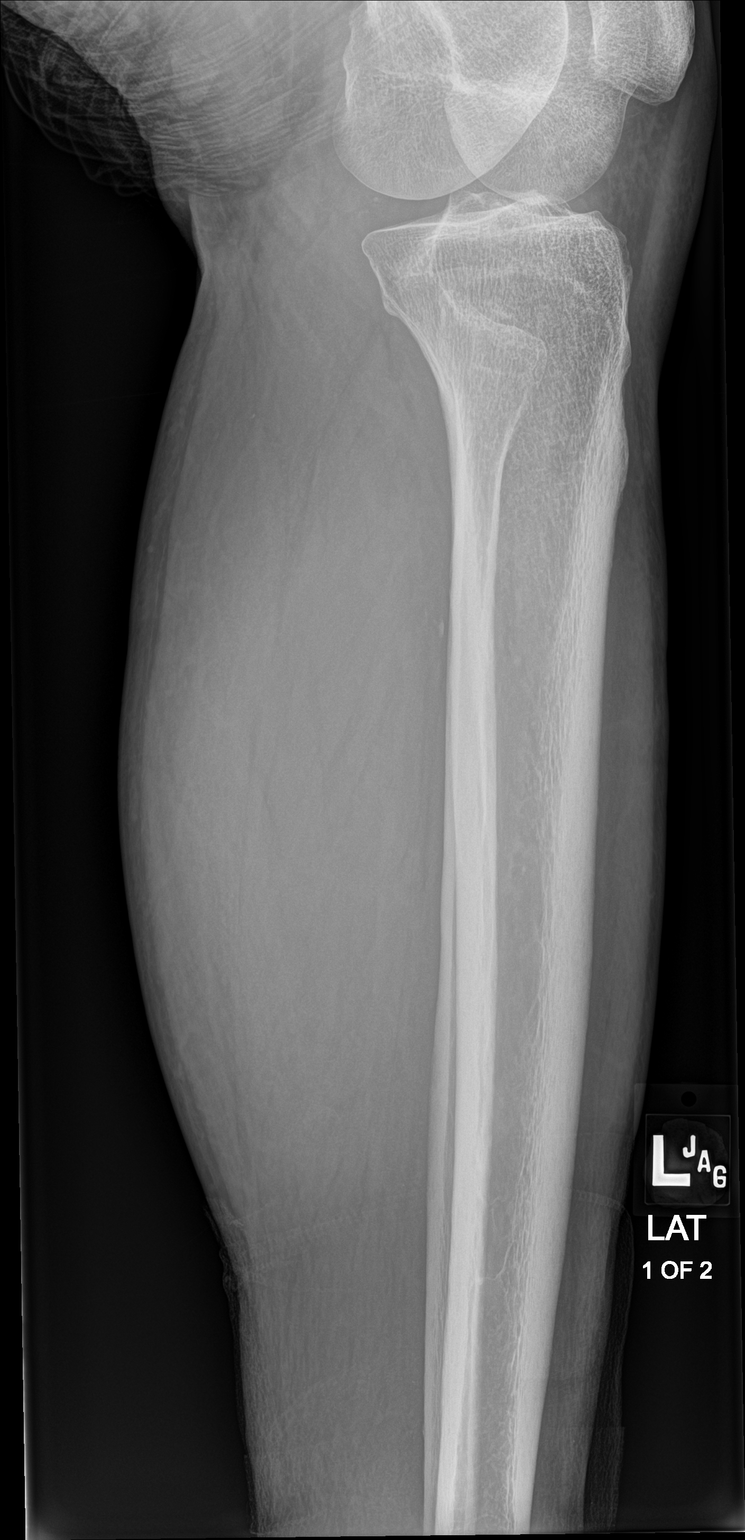

[tibia lat (2 of 2)]
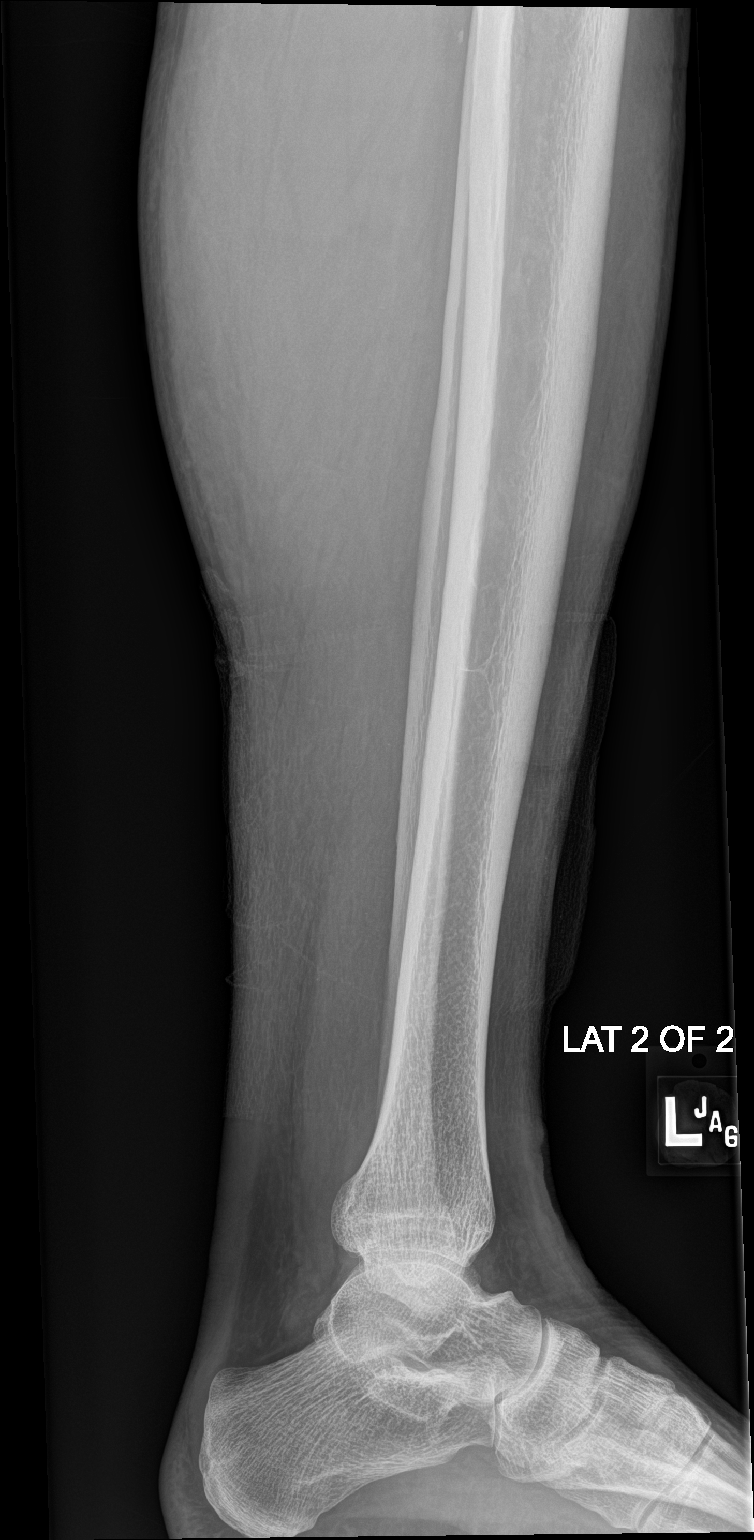

[4 of 4 positions shown; findings below may reference images not displayed]

FINDINGS: There is no evidence of fracture or other focal bone lesions. No
radiopaque foreign body.
IMPRESSION: No acute osseous abnormality or radiopaque foreign body.

## 2022-09-13 DIAGNOSIS — R972 Elevated prostate specific antigen [PSA]: Secondary | ICD-10-CM | POA: Diagnosis not present

## 2022-09-13 DIAGNOSIS — I10 Essential (primary) hypertension: Secondary | ICD-10-CM | POA: Diagnosis not present

## 2022-09-13 DIAGNOSIS — E663 Overweight: Secondary | ICD-10-CM | POA: Diagnosis not present

## 2022-09-13 DIAGNOSIS — Z6825 Body mass index (BMI) 25.0-25.9, adult: Secondary | ICD-10-CM | POA: Diagnosis not present

## 2023-01-23 DIAGNOSIS — H02051 Trichiasis without entropian right upper eyelid: Secondary | ICD-10-CM | POA: Diagnosis not present

## 2023-03-07 ENCOUNTER — Ambulatory Visit (INDEPENDENT_AMBULATORY_CARE_PROVIDER_SITE_OTHER): Payer: BC Managed Care – PPO

## 2023-03-07 ENCOUNTER — Ambulatory Visit
Admission: EM | Admit: 2023-03-07 | Discharge: 2023-03-07 | Disposition: A | Discharge status: Home / Self Care | Payer: BC Managed Care – PPO | Attending: Nurse Practitioner | Admitting: Nurse Practitioner

## 2023-03-07 DIAGNOSIS — M5136 Other intervertebral disc degeneration, lumbar region: Secondary | ICD-10-CM | POA: Diagnosis not present

## 2023-03-07 DIAGNOSIS — M545 Low back pain, unspecified: Secondary | ICD-10-CM

## 2023-03-07 DIAGNOSIS — G8929 Other chronic pain: Secondary | ICD-10-CM

## 2023-03-07 DIAGNOSIS — M25561 Pain in right knee: Secondary | ICD-10-CM | POA: Diagnosis not present

## 2023-03-07 DIAGNOSIS — M549 Dorsalgia, unspecified: Secondary | ICD-10-CM | POA: Diagnosis not present

## 2023-03-07 DIAGNOSIS — M47816 Spondylosis without myelopathy or radiculopathy, lumbar region: Secondary | ICD-10-CM | POA: Diagnosis not present

## 2023-03-07 MED ORDER — MELOXICAM 5 MG PO CAPS: 5.0000 mg | ORAL_CAPSULE | Freq: Every day | ORAL | 0 refills | Status: DC

## 2023-03-07 MED ORDER — DEXAMETHASONE SODIUM PHOSPHATE 10 MG/ML IJ SOLN
10.0000 mg | INTRAMUSCULAR | Status: AC
  Administered 2023-03-07: 10 mg via INTRAMUSCULAR

## 2023-03-07 NOTE — Discharge Instructions (Addendum)
You were given an injection of Decadron 10 mg.  Do not start the meloxicam until 03/08/2023. The x-ray of your lower back does show worsening degenerative changes when compared to your x-ray from 2010. May take Tylenol arthritis strength 650 mg tablets as needed for breakthrough pain or discomfort. May apply ice or heat as needed.  Apply ice for pain or swelling, heat for spasm or stiffness.  Apply for 20 minutes, remove for 1 hour, repeat as needed. Try to remain active. If symptoms fail to improve, it is recommended that you follow-up with orthopedics for further evaluation.  You can follow-up with [facility] or with [facility]. Follow-up as needed.

## 2023-03-07 NOTE — ED Provider Notes (Signed)
[facility]    CSN: [id number] Arrival date & time: 03/07/23  1027      History   Chief Complaint No chief complaint on file.   HPI Matthew Brandt is a 67 y.o. male.   The history is provided by the patient.   The patient presents for complaints of right knee pain has been present for the past week.  Patient states that he was sitting in the floor fixing his furnace with his legs crisscrossed.  He states immediately after, he noticed some pain to the outside of the right knee.  He states that the pain initially went away.  He states as time passed, he has noticed pain in the knee that goes to the back of the knee and up his thigh and down into the calf.  Patient states he has pain when he moves the knee a certain way.  He also states that he has pain in his low back, with a history of a herniated disc.  Patient states that the back pain comes and goes, and depends on what he is doing.  He denies urinary symptoms, or loss of bowel or bladder.  Patient further denies injury, trauma, swelling, or decreased range of motion of the right knee.  Patient states that he has been taking Tylenol for his symptoms.  He states that he became concerned because his pain has continued to persist.  Patient is concerned that his knee pain may be related to his prior history of a herniated disc.  History reviewed. No pertinent past medical history.  There are no problems to display for this patient.   Past Surgical History:  Procedure Laterality Date   CORONARY ANGIOPLASTY         Home Medications    Prior to Admission medications   Medication Sig Start Date End Date Taking? Authorizing Provider  Meloxicam 5 MG CAPS Take 5 mg by mouth daily. 03/07/23  Yes Yared Barefoot-Warren, Sadie Haber, NP    Family History History reviewed. No pertinent family history.  Social History Social History   Tobacco Use   Smoking status: Former   Smokeless tobacco: Never  Substance Use Topics   Alcohol use:  Yes   Drug use: Never     Allergies   Codeine   Review of Systems Review of Systems Per HPI  Physical Exam Triage Vital Signs ED Triage Vitals  Encounter Vitals Group     BP 03/07/23 1032 (!) 189/83     Systolic BP Percentile --      Diastolic BP Percentile --      Pulse Rate 03/07/23 1032 77     Resp 03/07/23 1032 12     Temp 03/07/23 1032 98.1 F (36.7 C)     Temp Source 03/07/23 1032 Oral     SpO2 03/07/23 1032 97 %     Weight --      Height --      Head Circumference --      Peak Flow --      Pain Score 03/07/23 1038 4     Pain Loc --      Pain Education --      Exclude from Growth Chart --    No data found.  Updated Vital Signs BP (!) 189/83 (BP Location: Right Arm)   Pulse 77   Temp 98.1 F (36.7 C) (Oral)   Resp 12   SpO2 97%   Visual Acuity Right Eye Distance:   Left Eye Distance:  Bilateral Distance:    Right Eye Near:   Left Eye Near:    Bilateral Near:     Physical Exam Vitals and nursing note reviewed.  Constitutional:      General: He is not in acute distress.    Appearance: Normal appearance.  HENT:     Head: Normocephalic.  Eyes:     Extraocular Movements: Extraocular movements intact.     Pupils: Pupils are equal, round, and reactive to light.  Pulmonary:     Effort: Pulmonary effort is normal.  Musculoskeletal:     Lumbar back: Normal. No swelling or tenderness. Normal range of motion.     Right knee: No swelling or deformity. Normal range of motion. No tenderness. Normal pulse.  Skin:    General: Skin is warm and dry.  Neurological:     General: No focal deficit present.     Mental Status: He is alert and oriented to person, place, and time.  Psychiatric:        Mood and Affect: Mood normal.        Behavior: Behavior normal.      UC Treatments / Results  Labs (all labs ordered are listed, but only abnormal results are displayed) Labs Reviewed - No data to display  EKG   Radiology DG Lumbar Spine  Complete  Result Date: 03/07/2023 CLINICAL DATA:  Back pain EXAM: LUMBAR SPINE - COMPLETE 4 VIEW COMPARISON:  Lumbar spine radiograph dated August 15, 2008 FINDINGS: No evidence of lumbar spine fracture. Dextrocurvature of the lumbar spine. Multilevel moderate to severe degenerative disc disease, worsened when compared with 2010 prior most severe at L2-L3 and L4-L5. Moderate multilevel facet arthropathy. Vascular calcifications. IMPRESSION: Multilevel moderate to severe degenerative disc disease, worsened when compared with 2010 prior. Electronically Signed   By: Allegra Lai M.D.   On: 03/07/2023 11:40    Procedures Procedures (including critical care time)  Medications Ordered in UC Medications - No data to display  Initial Impression / Assessment and Plan / UC Course  I have reviewed the triage vital signs and the nursing notes.  Pertinent labs & imaging results that were available during my care of the patient were reviewed by me and considered in my medical decision making (see chart for details).  The patient is well-appearing, he is in no acute distress, vital signs are stable.  X-ray of the lumbar spine does show moderate multilevel degenerative disease.  With regard to his knee, has not experienced any trauma, swelling, or inability to ambulate, imaging was deferred.  With regard to his back, patient does have a history of low back pain, x-ray today compared to x-ray in 2010 shows worsening symptoms.  Decadron 10 mg IM administered for inflammation relating to his right knee pain and low back pain.  Meloxicam 5 mg capsules were prescribed for pain.  Supportive care recommendations were provided and discussed with the patient to include use of Tylenol arthritis strength 650 mg tablets as needed for breakthrough pain or discomfort, ice or heat as needed, and remaining active.  Patient was advised that if symptoms do not improve with this treatment, recommend following up with orthopedics  for further evaluation.  Patient was given information for [facility] and for [facility].  Patient is in agreement with this plan of care and verbalizes understanding.  All questions were answered.  Patient stable for discharge.  Final Clinical Impressions(s) / UC Diagnoses   Final diagnoses:  Chronic low back pain without sciatica, unspecified  back pain laterality  Right knee pain, unspecified chronicity     Discharge Instructions      The x-ray     ED Prescriptions     Medication Sig Dispense Auth. Provider   Meloxicam 5 MG CAPS Take 5 mg by mouth daily. 30 capsule Briceida Rasberry-Warren, Sadie Haber, NP      PDMP not reviewed this encounter.   Abran Cantor, NP 03/07/23 1159

## 2023-03-07 NOTE — ED Triage Notes (Signed)
Pt c/o lower back and RT knee pain, pt has hx of herniated discs. Pt says he was sitting criss cross working on his furnace and felt a pain in his right leg. And it some times goes down into the leg and up the back t thinks it may be sciatica

## 2023-03-21 ENCOUNTER — Emergency Department (HOSPITAL_COMMUNITY): Payer: BC Managed Care – PPO

## 2023-03-21 ENCOUNTER — Emergency Department (HOSPITAL_COMMUNITY)
Admission: EM | Admit: 2023-03-21 | Discharge: 2023-03-21 | Disposition: A | Discharge status: Home / Self Care | Payer: BC Managed Care – PPO | Attending: Emergency Medicine | Admitting: Emergency Medicine

## 2023-03-21 ENCOUNTER — Other Ambulatory Visit: Payer: Self-pay

## 2023-03-21 ENCOUNTER — Encounter (HOSPITAL_COMMUNITY): Payer: Self-pay | Admitting: Emergency Medicine

## 2023-03-21 DIAGNOSIS — X509XXA Other and unspecified overexertion or strenuous movements or postures, initial encounter: Secondary | ICD-10-CM | POA: Insufficient documentation

## 2023-03-21 DIAGNOSIS — S8391XA Sprain of unspecified site of right knee, initial encounter: Secondary | ICD-10-CM | POA: Diagnosis not present

## 2023-03-21 DIAGNOSIS — S8991XA Unspecified injury of right lower leg, initial encounter: Secondary | ICD-10-CM | POA: Diagnosis not present

## 2023-03-21 DIAGNOSIS — M25561 Pain in right knee: Secondary | ICD-10-CM | POA: Diagnosis not present

## 2023-03-21 DIAGNOSIS — S838X1A Sprain of other specified parts of right knee, initial encounter: Secondary | ICD-10-CM | POA: Diagnosis not present

## 2023-03-21 MED ORDER — OXYCODONE-ACETAMINOPHEN 5-325 MG PO TABS
1.0000 | ORAL_TABLET | Freq: Once | ORAL | Status: AC
  Administered 2023-03-21: 1 via ORAL
  Filled 2023-03-21: qty 1

## 2023-03-21 MED ORDER — OXYCODONE HCL 5 MG PO TABS: 5.0000 mg | ORAL_TABLET | ORAL | 0 refills | Status: AC | PRN

## 2023-03-21 NOTE — ED Provider Notes (Signed)
Matthew Brandt EMERGENCY DEPARTMENT AT [facility] Provider Note   CSN: [id number] Arrival date & time: 03/21/23  1858     History  Chief Complaint  Patient presents with   Knee Pain    Matthew Brandt is a 67 y.o. male who presents to the emergency department complaining of right knee pain.  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  He was working on a lawnmower today when he stepped wrong and felt like something "ripped" in his knee.  Pain is mostly in his posterior right knee.  States that he is unable to bear weight.  He did try taking some Aleve before coming to the ER, without relief.   Knee Pain      Home Medications Prior to Admission medications   Medication Sig Start Date End Date Taking? Authorizing Provider  oxyCODONE (ROXICODONE) 5 MG immediate release tablet Take 1 tablet (5 mg total) by mouth every 4 (four) hours as needed for severe pain or breakthrough pain. 03/21/23  Yes Shaylinn Hladik T, PA-C      Allergies    Codeine    Review of Systems   Review of Systems  Musculoskeletal:  Positive for arthralgias.  All other systems reviewed and are negative.   Physical Exam Updated Vital Signs BP (!) 159/94 (BP Location: Left Arm)   Pulse 99   Temp 98.1 F (36.7 C) (Oral)   Resp 15   Wt 86.2 kg   SpO2 97%   BMI 26.50 kg/m  Physical Exam Vitals and nursing note reviewed.  Constitutional:      Appearance: Normal appearance.  HENT:     Head: Normocephalic and atraumatic.  Eyes:     Conjunctiva/sclera: Conjunctivae normal.  Cardiovascular:     Pulses:          Posterior tibial pulses are 2+ on the right side and 2+ on the left side.  Pulmonary:     Effort: Pulmonary effort is normal. No respiratory distress.  Musculoskeletal:     Right knee: Swelling present.     Left knee: Normal.     Comments: Some generalized right  knee swelling, without obvious effusion.  No bony tenderness.  No obvious laxity or instability.  Pain worse with extending the knee.  Skin:    General: Skin is warm and dry.  Neurological:     Mental Status: He is alert.  Psychiatric:        Mood and Affect: Mood normal.        Behavior: Behavior normal.     ED Results / Procedures / Treatments   Labs (all labs ordered are listed, but only abnormal results are displayed) Labs Reviewed - No data to display  EKG None  Radiology DG Knee Complete 4 Views Right  Result Date: 03/21/2023 CLINICAL DATA:  Persistent pain.  Knee injury. EXAM: RIGHT KNEE - COMPLETE 4+ VIEW COMPARISON:  None Available. FINDINGS: No evidence of fracture, dislocation, or joint effusion. No evidence of arthropathy or other focal bone abnormality. Soft tissues are unremarkable. IMPRESSION: No acute fracture or dislocation. Electronically Signed   By: Minerva Fester M.D.   On: 03/21/2023 19:44    Procedures Procedures    Medications Ordered in ED Medications  oxyCODONE-acetaminophen (PERCOCET/ROXICET) 5-325 MG per tablet 1 tablet (has no administration in time range)  ED Course/ Medical Decision Making/ A&P                                 Medical Decision Making Amount and/or Complexity of Data Reviewed Radiology: ordered.  Risk Prescription drug management.   This patient is a 67 y.o. male  who presents to the ED for concern of right knee injury.   Past Medical History / Co-morbidities / Social History: Low back pain, herniated disc  Additional history: Chart reviewed. Pertinent results include: PDMP reviewed  Physical Exam: Physical exam performed. The pertinent findings include: Hypertensive, otherwise normal vital signs.  No acute distress.  Right knee generalized swelling without effusion.  No significant laxity on my exam.  Neurovascularly intact in bilateral lower extremities.  Pain worse with knee extension.  Lab Tests/Imaging  studies: I personally interpreted labs/imaging and the pertinent results include: X-ray of the right knee without acute abnormalities.. I agree with the radiologist interpretation.  Medications: I ordered medication including Percocet.  I have reviewed the patients home medicines and have made adjustments as needed.  Treatment: Patient given knee immobilizer and crutches   Disposition: After consideration of the diagnostic results and the patients response to treatment, I feel that emergency department workup does not suggest an emergent condition requiring admission or immediate intervention beyond what has been performed at this time. The plan is: Discharge to home with symptomatic treatment of likely right knee sprain.  No significant laxity on my exam, but due to severity of patient's pain, I do have some concern for more severe ligamentous injury.  Will recommend follow-up with orthopedist, given contact information.  Given short course of pain medication as needed for breakthrough pain.. The patient is safe for discharge and has been instructed to return immediately for worsening symptoms, change in symptoms or any other concerns.  Final Clinical Impression(s) / ED Diagnoses Final diagnoses:  Injury of right knee, initial encounter  Sprain of right knee, unspecified ligament, initial encounter    Rx / DC Orders ED Discharge Orders          Ordered    oxyCODONE (ROXICODONE) 5 MG immediate release tablet  Every 4 hours PRN        03/21/23 2106           Portions of this report may have been transcribed using voice recognition software. Every effort was made to ensure accuracy; however, inadvertent computerized transcription errors may be present.    Jeanella Flattery 03/21/23 2112    Bethann Berkshire, MD 03/22/23 1239

## 2023-03-21 NOTE — ED Triage Notes (Signed)
Pt complains of right knee pain x 3 weeks. 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

## 2023-03-21 NOTE — ED Notes (Signed)
Portable Xray at bedside.

## 2023-03-21 NOTE — Discharge Instructions (Addendum)
You were seen in the emergency department for knee pain.  As we discussed your x-ray was reassuring, there was no broken or dislocated bones.  I suspect you likely sprained your knee.  This could have been a stretch or possible tear of one of the ligaments in your knee.  I have given you some pain medication that you can take as needed for breakthrough pain.  Otherwise please take ibuprofen and/or Tylenol.  We have placed your knee in an immobilizer and given you crutches.  Please follow-up with the orthopedist, I have attached their contact information for you to give them a call.

## 2023-03-21 NOTE — ED Notes (Signed)
Patient reports R leg pain x3 weeks that has affected different areas of his leg throughout the time. States he was sitting on the floor with legs cross for an extended period of time. Originally thought pain was related to sciatica due to history of herniated disc. 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 Unable to bear weight or ambulate freely

## 2023-03-24 ENCOUNTER — Encounter (HOSPITAL_COMMUNITY): Payer: Self-pay | Admitting: Emergency Medicine

## 2023-03-25 ENCOUNTER — Encounter: Payer: Self-pay | Admitting: Orthopedic Surgery

## 2023-03-25 ENCOUNTER — Ambulatory Visit: Payer: BC Managed Care – PPO | Admitting: Orthopedic Surgery

## 2023-03-25 VITALS — BP 179/99 | HR 80 | Ht 71.0 in | Wt 182.0 lb

## 2023-03-25 DIAGNOSIS — S86111A Strain of other muscle(s) and tendon(s) of posterior muscle group at lower leg level, right leg, initial encounter: Secondary | ICD-10-CM | POA: Diagnosis not present

## 2023-03-25 DIAGNOSIS — M7121 Synovial cyst of popliteal space [Baker], right knee: Secondary | ICD-10-CM | POA: Diagnosis not present

## 2023-03-25 DIAGNOSIS — M25561 Pain in right knee: Secondary | ICD-10-CM | POA: Diagnosis not present

## 2023-03-25 MED ORDER — HYDROCODONE-ACETAMINOPHEN 5-325 MG PO TABS: 1.0000 | ORAL_TABLET | Freq: Four times a day (QID) | ORAL | 0 refills | Status: AC | PRN

## 2023-03-25 MED ORDER — DICLOFENAC POTASSIUM 50 MG PO TABS: 50.0000 mg | ORAL_TABLET | Freq: Two times a day (BID) | ORAL | 3 refills | Status: AC

## 2023-03-25 NOTE — Progress Notes (Signed)
Chief Complaint  Patient presents with   Knee Pain    DOI 03/21/23 to right knee stepped wrong and felt like it ripped apart having severe pain and not able o put full weight     Matthew Brandt presents to [place] after being seen at the urgent care facility I believe.  He was noted to have increasing posterior knee pain on around 03/21/2023 when he stepped wrong and felt something rip in the back of his knee and has had pain and dysfunction ever since.  He was having some pain along the lateral side of his leg that was radiating somewhat prior to this injury and he thought it may have been his herniated disc that was bothering him which may have well been the case but things change when he stepped in a hole.  So he has been prescribed meloxicam but he says his insurance would not cover that so he did not take that he has been on crutches he has been having trouble walking.  He is a non-smoker he is not diabetic his pharmacy is [facility]  He is not on any anticoagulants.  He reports nothing on review of systems other than restated  BP (!) 179/99   Pulse 80   Ht 5\' 11"  (1.803 m)   Wt 182 lb (82.6 kg)   BMI 25.38 kg/m   His knee examination is remarkable for skin is intact the pain is all posteriorly in the superior part of the calf just below the popliteal crease there is some pain in the popliteal crease as well.  I do not feel any fullness there is anterior knee joint is completely normal including patella quadriceps tendon McMurray's sign is negative he does have some lateral pain posterior to the lateral collateral ligament.  ACL PCL tests were normal collateral ligaments were stable  Again his gait was altered and he was using a set of crutches  I was able to see images of his knee and they look relatively normal I do not see anything that would explain his symptoms  Differential diagnosis ruptured Baker's cyst, strain of the gastroc, posterior loose body.  Recommend diclofenac, 1 course of Norco in  case he has a lot of pain during the night and MRI of the knee to rule out Baker's cyst or loose body.  Meds ordered this encounter  Medications   diclofenac (CATAFLAM) 50 MG tablet    Sig: Take 1 tablet (50 mg total) by mouth 2 (two) times daily.    Dispense:  60 tablet    Refill:  3   HYDROcodone-acetaminophen (NORCO/VICODIN) 5-325 MG tablet    Sig: Take 1 tablet by mouth every 6 (six) hours as needed for moderate pain.    Dispense:  30 tablet    Refill:  0

## 2023-03-25 NOTE — Patient Instructions (Signed)
Central scheduling [phone] Patient also needs work note from 03/24/2023 until September 9th

## 2023-03-26 ENCOUNTER — Encounter: Payer: Self-pay | Admitting: Orthopedic Surgery

## 2023-03-31 ENCOUNTER — Other Ambulatory Visit: Payer: Self-pay

## 2023-04-01 ENCOUNTER — Ambulatory Visit (HOSPITAL_COMMUNITY)
Admission: RE | Admit: 2023-04-01 | Discharge: 2023-04-01 | Disposition: A | Discharge status: Home / Self Care | Payer: BC Managed Care – PPO | Source: Ambulatory Visit | Attending: Orthopedic Surgery | Admitting: Orthopedic Surgery

## 2023-04-01 DIAGNOSIS — S83271A Complex tear of lateral meniscus, current injury, right knee, initial encounter: Secondary | ICD-10-CM | POA: Diagnosis not present

## 2023-04-01 DIAGNOSIS — M25461 Effusion, right knee: Secondary | ICD-10-CM | POA: Diagnosis not present

## 2023-04-01 DIAGNOSIS — M660 Rupture of popliteal cyst: Secondary | ICD-10-CM | POA: Diagnosis not present

## 2023-04-01 DIAGNOSIS — M25561 Pain in right knee: Secondary | ICD-10-CM | POA: Diagnosis not present

## 2023-04-01 DIAGNOSIS — S83241A Other tear of medial meniscus, current injury, right knee, initial encounter: Secondary | ICD-10-CM | POA: Diagnosis not present

## 2023-04-17 ENCOUNTER — Ambulatory Visit: Payer: BC Managed Care – PPO | Admitting: Orthopedic Surgery

## 2023-04-17 DIAGNOSIS — M23321 Other meniscus derangements, posterior horn of medial meniscus, right knee: Secondary | ICD-10-CM

## 2023-04-17 DIAGNOSIS — M1711 Unilateral primary osteoarthritis, right knee: Secondary | ICD-10-CM

## 2023-04-17 DIAGNOSIS — M23300 Other meniscus derangements, unspecified lateral meniscus, right knee: Secondary | ICD-10-CM | POA: Diagnosis not present

## 2023-04-17 NOTE — Progress Notes (Signed)
Follow-up MRI right knee  Chief Complaint  Patient presents with   Results    MRI results    Encounter Diagnoses  Name Primary?   Derangement of posterior horn of medial meniscus of right knee Yes   Primary osteoarthritis of right knee    Meniscus, lateral, derangement, right    My interpretation of his MRI is that he has arthritis and torn medial and lateral menisci  Initial presentation Mr. Coulbourn presents to [place] after being seen at the urgent care facility I believe.  He was noted to have increasing posterior knee pain on around 03/21/2023 when he stepped wrong and felt something rip in the back of his knee and has had pain and dysfunction ever since.  He was having some pain along the lateral side of his leg that was radiating somewhat prior to this injury and he thought it may have been his herniated disc that was bothering him which may have well been the case but things change when he stepped in a hole.   So he has been prescribed meloxicam but he says his insurance would not cover that so he did not take that he has been on crutches he has been having trouble walking.  He is a non-smoker he is not diabetic his pharmacy is [facility]  MRI report IMPRESSION: 1. Radial tear of the root of the posterior horn of the medial meniscus with peripheral meniscal extrusion. 2. Complex tear of the anterior horn of the lateral meniscus. 3. Mild partial-thickness cartilage loss of the medial femorotibial compartment.     Electronically Signed   By: Elige Ko M.D.   On: 04/12/2023 05:39  He is relatively minimally symptomatic using Tylenol ibuprofen return to work doing well no catching locking giving way or mechanical symptoms  Continue nonoperative care until symptoms warrant surgical intervention

## 2023-06-06 DIAGNOSIS — Z23 Encounter for immunization: Secondary | ICD-10-CM | POA: Diagnosis not present

## 2023-06-06 DIAGNOSIS — M62830 Muscle spasm of back: Secondary | ICD-10-CM | POA: Diagnosis not present

## 2023-06-06 DIAGNOSIS — Z6826 Body mass index (BMI) 26.0-26.9, adult: Secondary | ICD-10-CM | POA: Diagnosis not present

## 2023-06-06 DIAGNOSIS — M47816 Spondylosis without myelopathy or radiculopathy, lumbar region: Secondary | ICD-10-CM | POA: Diagnosis not present

## 2023-06-06 DIAGNOSIS — M5126 Other intervertebral disc displacement, lumbar region: Secondary | ICD-10-CM | POA: Diagnosis not present

## 2023-06-06 DIAGNOSIS — E663 Overweight: Secondary | ICD-10-CM | POA: Diagnosis not present

## 2023-06-13 DIAGNOSIS — M9902 Segmental and somatic dysfunction of thoracic region: Secondary | ICD-10-CM | POA: Diagnosis not present

## 2023-06-13 DIAGNOSIS — M5442 Lumbago with sciatica, left side: Secondary | ICD-10-CM | POA: Diagnosis not present

## 2023-06-13 DIAGNOSIS — M9903 Segmental and somatic dysfunction of lumbar region: Secondary | ICD-10-CM | POA: Diagnosis not present

## 2023-06-13 DIAGNOSIS — M9905 Segmental and somatic dysfunction of pelvic region: Secondary | ICD-10-CM | POA: Diagnosis not present

## 2023-06-20 DIAGNOSIS — M9902 Segmental and somatic dysfunction of thoracic region: Secondary | ICD-10-CM | POA: Diagnosis not present

## 2023-06-20 DIAGNOSIS — M9903 Segmental and somatic dysfunction of lumbar region: Secondary | ICD-10-CM | POA: Diagnosis not present

## 2023-06-20 DIAGNOSIS — M5442 Lumbago with sciatica, left side: Secondary | ICD-10-CM | POA: Diagnosis not present

## 2023-06-20 DIAGNOSIS — M9905 Segmental and somatic dysfunction of pelvic region: Secondary | ICD-10-CM | POA: Diagnosis not present

## 2024-04-01 DIAGNOSIS — H52223 Regular astigmatism, bilateral: Secondary | ICD-10-CM | POA: Diagnosis not present

## 2024-04-06 DIAGNOSIS — K080 Exfoliation of teeth due to systemic causes: Secondary | ICD-10-CM | POA: Diagnosis not present
